# Patient Record
Sex: Female | Born: 1965 | Race: Black or African American | Hispanic: No | Marital: Married | Smoking: Never smoker
Health system: Southern US, Community
[De-identification: ages and names within clinical notes are randomized; demographics above are authoritative.]

## PROBLEM LIST (undated history)

## (undated) DIAGNOSIS — G8929 Other chronic pain: Secondary | ICD-10-CM

## (undated) DIAGNOSIS — M549 Dorsalgia, unspecified: Secondary | ICD-10-CM

## (undated) DIAGNOSIS — I10 Essential (primary) hypertension: Secondary | ICD-10-CM

## (undated) HISTORY — PX: BACK SURGERY: SHX140

## (undated) HISTORY — PX: TONSILLECTOMY: SUR1361

## (undated) HISTORY — PX: CHOLECYSTECTOMY: SHX55

## (undated) HISTORY — PX: ABDOMINAL HYSTERECTOMY: SHX81

---

## 2009-05-05 ENCOUNTER — Ambulatory Visit: Payer: Self-pay | Admitting: Family Medicine

## 2009-05-27 ENCOUNTER — Ambulatory Visit: Payer: Self-pay | Admitting: Otolaryngology

## 2009-06-05 ENCOUNTER — Ambulatory Visit: Payer: Self-pay | Admitting: Otolaryngology

## 2011-04-30 IMAGING — CR RIGHT ANKLE - COMPLETE 3+ VIEW
1 series · 5 of 5 positions shown · non-contrast
Comparison: none

REASON FOR EXAM: lateral ankle swelling and pain after injury
COMMENTS:

[Series 1: view not recorded · 0.17mm/px · 5 of 5 slices shown]
[im 1/5]
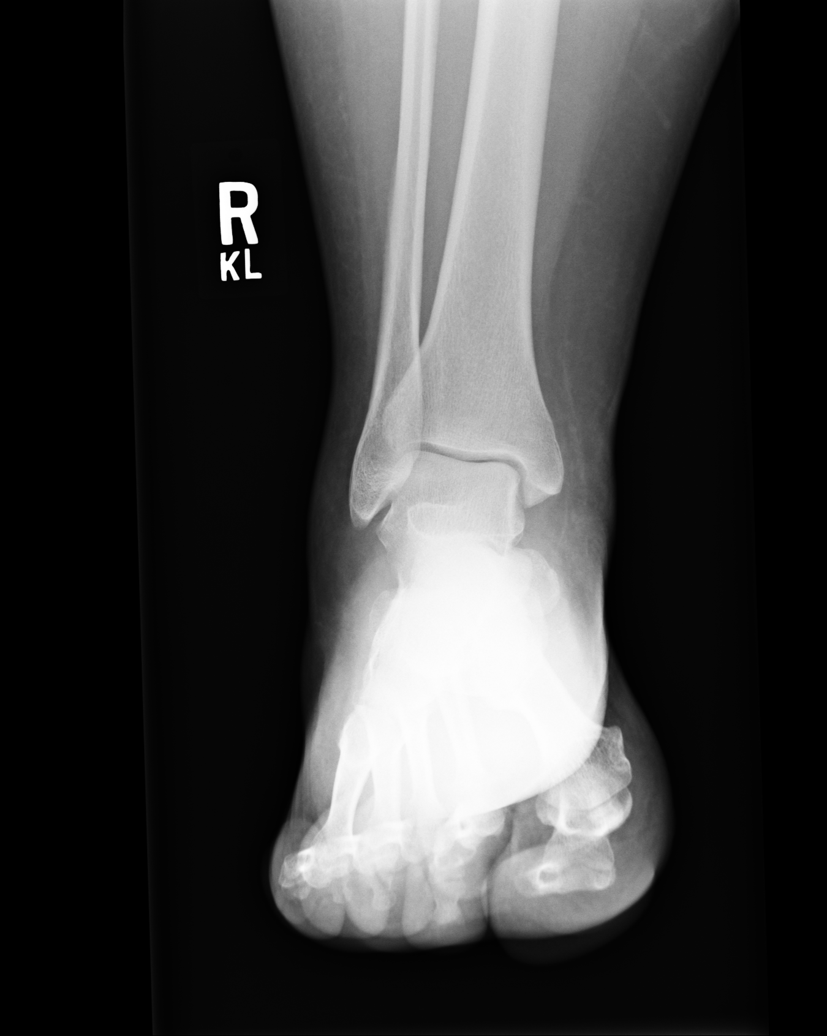
[im 2/5]
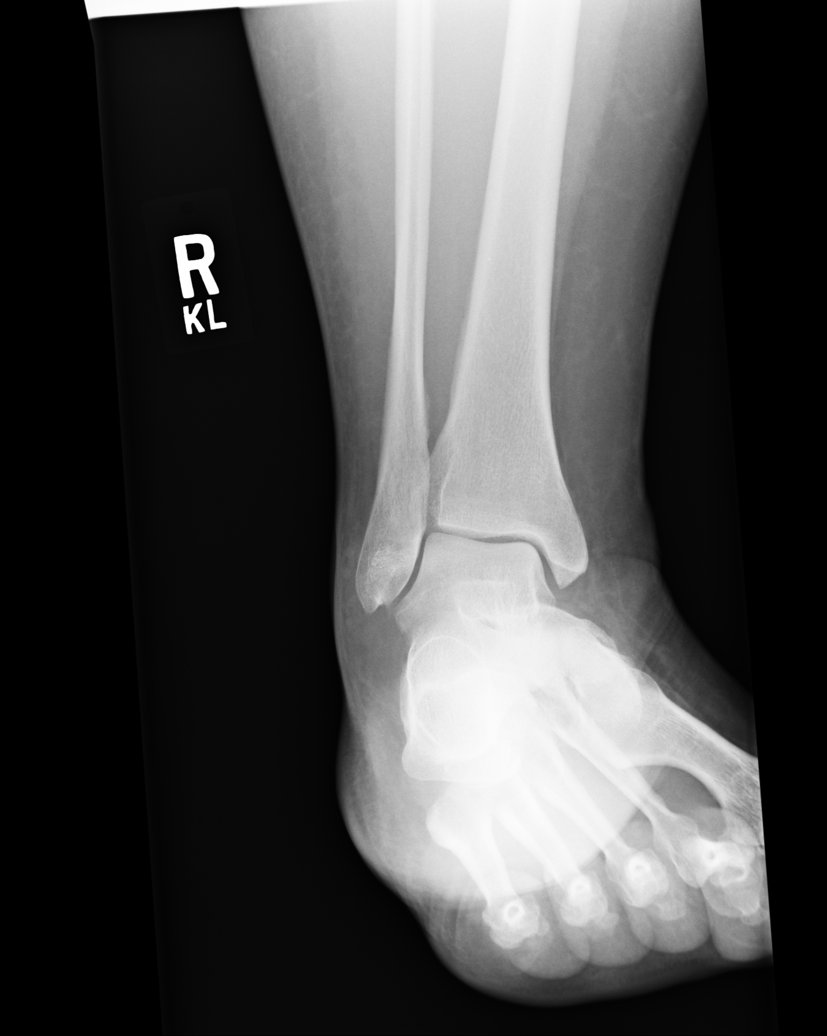
[im 3/5]
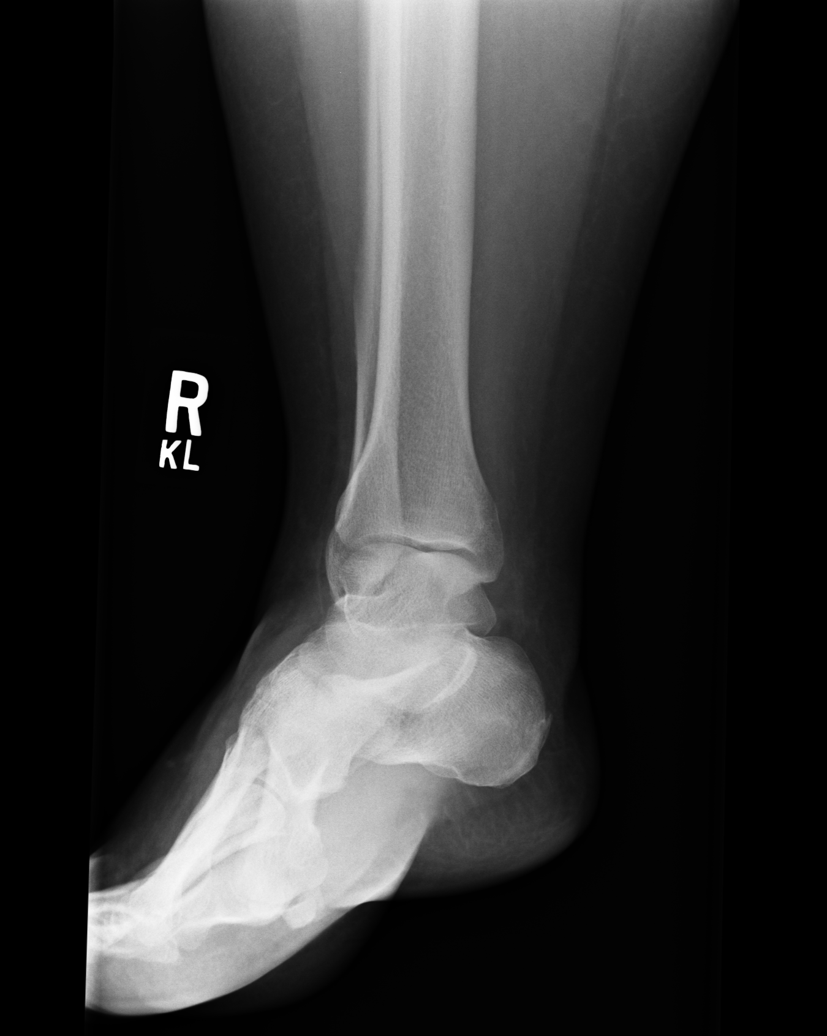
[im 4/5]
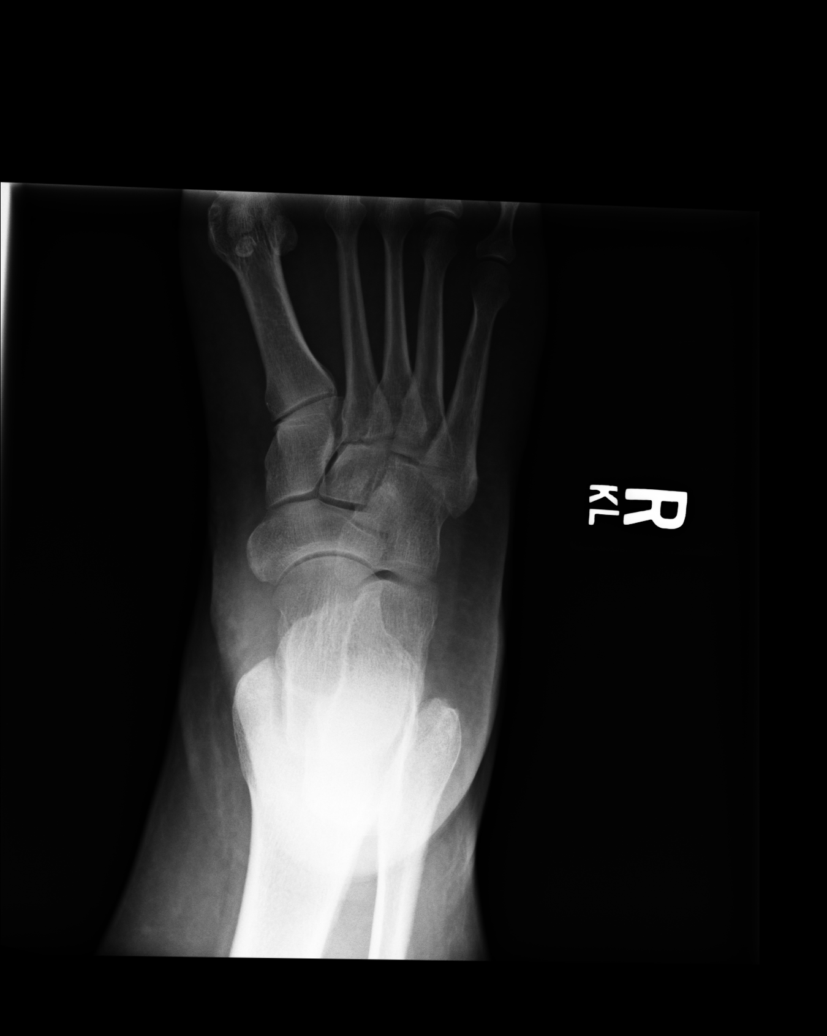
[im 5/5]
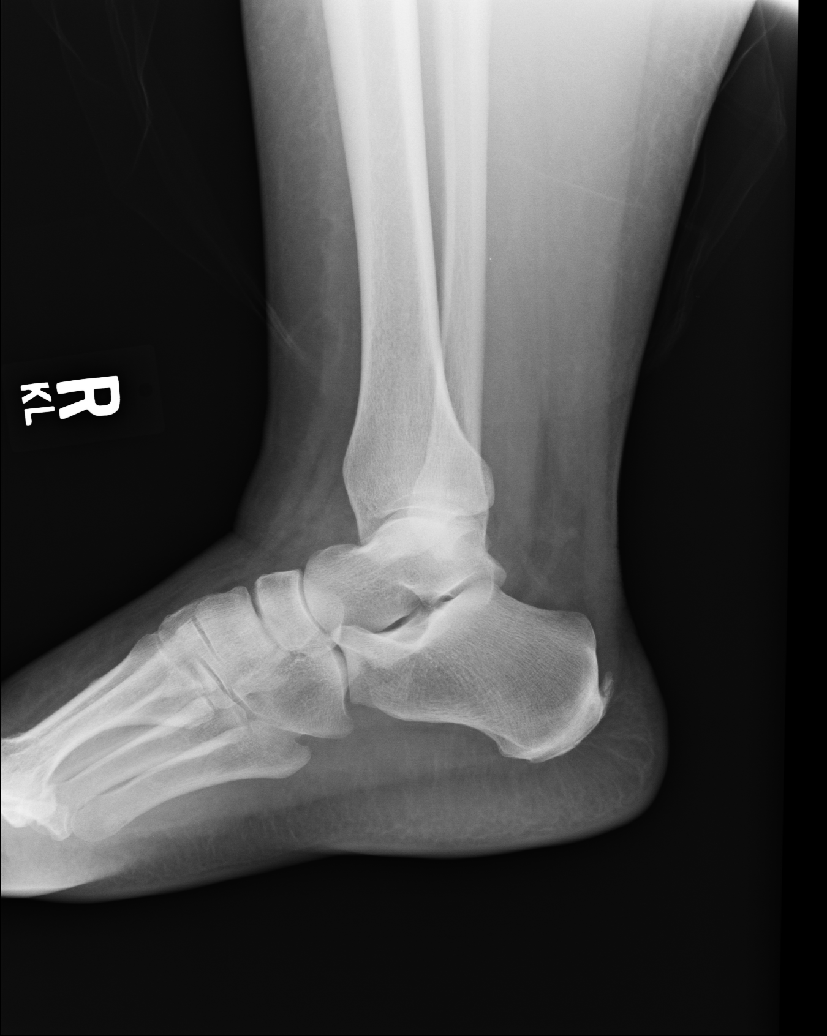

[5 of 5 positions shown; findings below may reference images not displayed]

PROCEDURE:     MDR - MDR ANKLE RIGHT COMPLETE  - May 05, 2009  [DATE]

RESULT:     Five views of the right ankle reveal diffuse soft tissue
swelling. The ankle joint mortise is preserved. The talar dome is intact. I
do not see evidence of an acute malleolar fracture. The metatarsal bases
appear normal. There is a large Achilles region calcaneal spur.
IMPRESSION: There is diffuse soft tissue swelling over the ankle but I
do not see evidence of an acute fracture.

## 2011-09-03 ENCOUNTER — Ambulatory Visit: Payer: Self-pay | Admitting: Emergency Medicine

## 2014-11-28 ENCOUNTER — Ambulatory Visit
Admission: EM | Admit: 2014-11-28 | Discharge: 2014-11-28 | Disposition: A | Discharge status: Home / Self Care | Payer: Commercial Managed Care - PPO | Attending: Internal Medicine | Admitting: Internal Medicine

## 2014-11-28 DIAGNOSIS — R809 Proteinuria, unspecified: Secondary | ICD-10-CM | POA: Diagnosis not present

## 2014-11-28 DIAGNOSIS — Z79899 Other long term (current) drug therapy: Secondary | ICD-10-CM | POA: Diagnosis not present

## 2014-11-28 DIAGNOSIS — B349 Viral infection, unspecified: Secondary | ICD-10-CM

## 2014-11-28 DIAGNOSIS — R509 Fever, unspecified: Secondary | ICD-10-CM | POA: Diagnosis not present

## 2014-11-28 DIAGNOSIS — M549 Dorsalgia, unspecified: Secondary | ICD-10-CM | POA: Diagnosis not present

## 2014-11-28 DIAGNOSIS — I10 Essential (primary) hypertension: Secondary | ICD-10-CM | POA: Diagnosis not present

## 2014-11-28 DIAGNOSIS — R05 Cough: Secondary | ICD-10-CM | POA: Diagnosis present

## 2014-11-28 DIAGNOSIS — R079 Chest pain, unspecified: Secondary | ICD-10-CM | POA: Diagnosis present

## 2014-11-28 HISTORY — DX: Other chronic pain: G89.29

## 2014-11-28 HISTORY — DX: Dorsalgia, unspecified: M54.9

## 2014-11-28 HISTORY — DX: Essential (primary) hypertension: I10

## 2014-11-28 LAB — RAPID STREP SCREEN (MED CTR MEBANE ONLY): Streptococcus, Group A Screen (Direct): NEGATIVE

## 2014-11-28 LAB — URINALYSIS COMPLETE WITH MICROSCOPIC (ARMC ONLY)
Glucose, UA: NEGATIVE mg/dL
Ketones, ur: NEGATIVE mg/dL
Leukocytes, UA: NEGATIVE
Nitrite: NEGATIVE
PH: 5.5 (ref 5.0–8.0)
Protein, ur: 100 mg/dL — AB
SPECIFIC GRAVITY, URINE: 1.02 (ref 1.005–1.030)

## 2014-11-28 NOTE — ED Notes (Signed)
Pt states "temp 103.7 last night, chest congestion, coughing, hurt all over,. Started Tuesday."

## 2014-11-28 NOTE — Discharge Instructions (Signed)
Strep swab was negative in the urgent care today; throat culture is pending. Urinalysis showed red blood cells and protein, but was without clear evidence of infection; urine culture is pending. Urine should be rechecked with your primary care provider in a few weeks; if red blood cells and protein persist, further evaluation with a nephrologist or urologist (types of kidney doctors) may be needed.  Fever, Adult A fever is a higher than normal body temperature. In an adult, an oral temperature around 98.6 F (37 C) is considered normal. A temperature of 100.4 F (38 C) or higher is generally considered a fever. Mild or moderate fevers generally have no long-term effects and often do not require treatment. Extreme fever (greater than or equal to 106 F or 41.1 C) can cause seizures. The sweating that may occur with repeated or prolonged fever may cause dehydration. Elderly people can develop confusion during a fever. A measured temperature can vary with:  Age.  Time of day.  Method of measurement (mouth, underarm, rectal, or ear). The fever is confirmed by taking a temperature with a thermometer. Temperatures can be taken different ways. Some methods are accurate and some are not.  An oral temperature is used most commonly. Electronic thermometers are fast and accurate.  An ear temperature will only be accurate if the thermometer is positioned as recommended by the manufacturer.  A rectal temperature is accurate and done for those adults who have a condition where an oral temperature cannot be taken.  An underarm (axillary) temperature is not accurate and not recommended. Fever is a symptom, not a disease.  CAUSES   Infections commonly cause fever.  Some noninfectious causes for fever include:  Some arthritis conditions.  Some thyroid or adrenal gland conditions.  Some immune system conditions.  Some types of cancer.  A medicine reaction.  High doses of certain street drugs  such as methamphetamine.  Dehydration.  Exposure to high outside or room temperatures.  Occasionally, the source of a fever cannot be determined. This is sometimes called a "fever of unknown origin" (FUO).  Some situations may lead to a temporary rise in body temperature that may go away on its own. Examples are:  Childbirth.  Surgery.  Intense exercise. HOME CARE INSTRUCTIONS   Take appropriate medicines for fever. Follow dosing instructions carefully. If you use acetaminophen to reduce the fever, be careful to avoid taking other medicines that also contain acetaminophen. Do not take aspirin for a fever if you are younger than age 85. There is an association with Reye's syndrome. Reye's syndrome is a rare but potentially deadly disease.  If an infection is present and antibiotics have been prescribed, take them as directed. Finish them even if you start to feel better.  Rest as needed.  Maintain an adequate fluid intake. To prevent dehydration during an illness with prolonged or recurrent fever, you may need to drink extra fluid.Drink enough fluids to keep your urine clear or pale yellow.  Sponging or bathing with room temperature water may help reduce body temperature. Do not use ice water or alcohol sponge baths.  Dress comfortably, but do not over-bundle. SEEK MEDICAL CARE IF:   You are unable to keep fluids down.  You develop vomiting or diarrhea.  You are not feeling at least partly better after 3 days.  You develop new symptoms or problems. SEEK IMMEDIATE MEDICAL CARE IF:   You have shortness of breath or trouble breathing.  You develop excessive weakness.  You are dizzy or  you faint.  You are extremely thirsty or you are making little or no urine.  You develop new pain that was not there before (such as in the head, neck, chest, back, or abdomen).  You have persistent vomiting and diarrhea for more than 1 to 2 days.  You develop a stiff neck or your eyes  become sensitive to light.  You develop a skin rash.  You have a fever or persistent symptoms for more than 2 to 3 days.  You have a fever and your symptoms suddenly get worse. MAKE SURE YOU:   Understand these instructions.  Will watch your condition.  Will get help right away if you are not doing well or get worse. Document Released: 10/13/2000 Document Revised: 09/03/2013 Document Reviewed: 02/18/2011 [facility] Patient Information 2015 [place], [place]. This information is not intended to replace advice given to you by your health care provider. Make sure you discuss any questions you have with your health care provider.

## 2014-11-28 NOTE — ED Provider Notes (Addendum)
CSN: [id number]     Arrival date & time 11/28/14  0718 History   First MD Initiated Contact with Patient 11/28/14 0756     CC:  Temp 103.7 last night, chest pain/coughing HPI  Patient is a 49 year old lady with past medical history of hypertension. She had the abrupt onset in the night last night of fever to 103.7. Malaise. Dry cough, little bit of right earache. Joint achiness. No runny/congested nose, no sore throat. No nausea/vomiting/diarrhea, no abdominal pain. Says her urine is dark, even that she has been pushing fluids. She has not been out in the heat. No rash. Had some palpitations 3-4 times over the last couple days, last for a couple minutes at a time. Looking for a local PCP. Patient works on the "Recognizing Sepsis" campaign, and was a little concerned about the fever. Past Medical History  Diagnosis Date  . Hypertension   . Chronic pain   . Back pain    Past Surgical History  Procedure Laterality Date  . Cholecystectomy    . Tonsillectomy    . Abdominal hysterectomy    . Back surgery:  scoliosis     Family Hx:  Heart disease, hypertension History  Substance Use Topics  . Smoking status: Never Smoker   . Smokeless tobacco: Not on file  . Alcohol Use: No    Review of Systems  All other systems reviewed and are negative.   Allergies  Review of patient's allergies indicates no known allergies.  Home Medications   Prior to Admission medications   Medication Sig Start Date End Date Taking? Authorizing Provider  amLODipine (NORVASC) 10 MG tablet Take 10 mg by mouth daily.   Yes Historical Provider, MD  traMADol (ULTRAM) 50 MG tablet Take by mouth every 6 (six) hours as needed.   Yes Historical Provider, MD   BP 139/83 mmHg  Pulse 90  Temp(Src) 98 F (36.7 C)  Resp 18  Ht 5\' 3"  (1.6 m)  Wt 270 lb (122.471 kg)  BMI 47.84 kg/m2  SpO2 100%  LMP  Physical Exam  Constitutional: She is oriented to person, place, and time. No distress.  Alert, nicely  groomed Sitting up on end of exam table  HENT:  Head: Atraumatic.  Bilateral TMs are red tinged, mildly dull, not bulging Minimal nasal congestion, but nasal mucosa bilaterally is excoriated, with scant mucousy material present Throat is slightly red  Eyes:  Conjugate gaze, no eye redness/drainage  Neck: Neck supple.  Cardiovascular: Normal rate and regular rhythm.   Pulmonary/Chest: No respiratory distress. She has no wheezes. She has no rales.  Coarse but symmetric breath sounds throughout  Abdominal: She exhibits no distension.  Musculoskeletal: Normal range of motion.  No leg swelling  Neurological: She is alert and oriented to person, place, and time.  Skin: Skin is warm and dry.  No cyanosis  Nursing note and vitals reviewed.   ED Course  Procedures  Results for orders placed or performed during the hospital encounter of 11/28/14  Rapid strep screen  Result Value Ref Range   Streptococcus, Group A Screen (Direct) NEGATIVE NEGATIVE    Urinalysis complete, with microscopic  Result Value Ref Range   Color, Urine YELLOW YELLOW   APPearance CLEAR CLEAR   Glucose, UA NEGATIVE NEGATIVE mg/dL   Bilirubin Urine 1+ (A) NEGATIVE   Ketones, ur NEGATIVE NEGATIVE mg/dL   Specific Gravity, Urine 1.020 1.005 - 1.030   Hgb urine dipstick 2+ (A) NEGATIVE   pH 5.5 5.0 -  8.0   Protein, ur 100 (A) NEGATIVE mg/dL   Nitrite NEGATIVE NEGATIVE   Leukocytes, UA NEGATIVE NEGATIVE   RBC / HPF 6-30 <3 RBC/hpf   WBC, UA 0-5 <3 WBC/hpf   Bacteria, UA RARE RARE   Squamous Epithelial / LPF 6-30 (A) RARE     MDM   1. Febrile illness, acute   2. Nonspecific syndrome suggestive of viral illness   3.     Protein and red blood cells in the urine  Throat culture and urine culture are pending. Encouraged patient to rest, push fluids, observe for a few days. Recheck for persistent fever.  Anti-inflammatories may be helpful for managing fever, achiness. Dr. Threasa Alpha office number given, to  establish care.  Patient will need urine rechecked in a few weeks. If red blood cells and protein persist, may need evaluation with a nephrologist or urologist to determine cause.     Eustace Moore, MD 11/28/14 1610  Eustace Moore, MD 11/28/14 [phone]

## 2014-12-01 LAB — CULTURE, GROUP A STREP (THRC)

## 2016-10-20 ENCOUNTER — Other Ambulatory Visit: Payer: Self-pay | Admitting: Nurse Practitioner

## 2016-10-20 DIAGNOSIS — Z1231 Encounter for screening mammogram for malignant neoplasm of breast: Secondary | ICD-10-CM

## 2016-11-09 ENCOUNTER — Ambulatory Visit
Admission: RE | Admit: 2016-11-09 | Discharge: 2016-11-09 | Disposition: A | Discharge status: Home / Self Care | Payer: Commercial Managed Care - PPO | Source: Ambulatory Visit | Attending: Nurse Practitioner | Admitting: Nurse Practitioner

## 2016-11-09 ENCOUNTER — Encounter: Payer: Self-pay | Admitting: Radiology

## 2016-11-09 DIAGNOSIS — Z1231 Encounter for screening mammogram for malignant neoplasm of breast: Secondary | ICD-10-CM

## 2016-11-15 ENCOUNTER — Other Ambulatory Visit: Payer: Self-pay | Admitting: *Deleted

## 2016-11-15 ENCOUNTER — Inpatient Hospital Stay
Admission: RE | Admit: 2016-11-15 | Discharge: 2016-11-15 | Disposition: A | Discharge status: Home / Self Care | Payer: Self-pay | Source: Ambulatory Visit | Attending: *Deleted | Admitting: *Deleted

## 2016-11-15 DIAGNOSIS — Z9289 Personal history of other medical treatment: Secondary | ICD-10-CM

## 2023-06-07 ENCOUNTER — Ambulatory Visit: Payer: 59 | Attending: Otolaryngology

## 2023-06-07 DIAGNOSIS — G4733 Obstructive sleep apnea (adult) (pediatric): Secondary | ICD-10-CM | POA: Insufficient documentation

## 2023-06-07 DIAGNOSIS — E669 Obesity, unspecified: Secondary | ICD-10-CM | POA: Insufficient documentation

## 2023-06-07 DIAGNOSIS — Z6838 Body mass index (BMI) 38.0-38.9, adult: Secondary | ICD-10-CM | POA: Insufficient documentation

## 2023-06-07 DIAGNOSIS — R0683 Snoring: Secondary | ICD-10-CM | POA: Diagnosis present

## 2023-06-07 DIAGNOSIS — I10 Essential (primary) hypertension: Secondary | ICD-10-CM | POA: Diagnosis not present

## 2023-06-07 DIAGNOSIS — E119 Type 2 diabetes mellitus without complications: Secondary | ICD-10-CM | POA: Diagnosis not present
